# Patient Record
Sex: Male | Born: 1995 | Race: Black or African American | Hispanic: No | Marital: Single | Smoking: Never smoker
Health system: Southern US, Community
[De-identification: ages and names within clinical notes are randomized; demographics above are authoritative.]

## PROBLEM LIST (undated history)

## (undated) DIAGNOSIS — S62102A Fracture of unspecified carpal bone, left wrist, initial encounter for closed fracture: Secondary | ICD-10-CM

## (undated) HISTORY — DX: Fracture of unspecified carpal bone, left wrist, initial encounter for closed fracture: S62.102A

---
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
## 2017-02-05 ENCOUNTER — Telehealth: Payer: Self-pay | Admitting: Family Medicine

## 2017-02-05 NOTE — Telephone Encounter (Signed)
Patient dropped off a health form to be filled out for work by Dr. Leretha PolSantiago. Telephone number is [phone]. Placed in nurses box.

## 2017-05-06 IMAGING — CR DG WRIST COMPLETE 3+V*R*
4 series · 4 of 4 positions shown · non-contrast
Comparison: None.

CLINICAL DATA: Initial encounter for Patient was playing basketball
yesterday and fell onto right wrist. Patient is complaining of right
wrist pain,swelling and limited movement. Sensory, motor, and radial
pulse intact.

EXAM:
RIGHT WRIST - COMPLETE 3+ VIEW

[x wrist pa right]
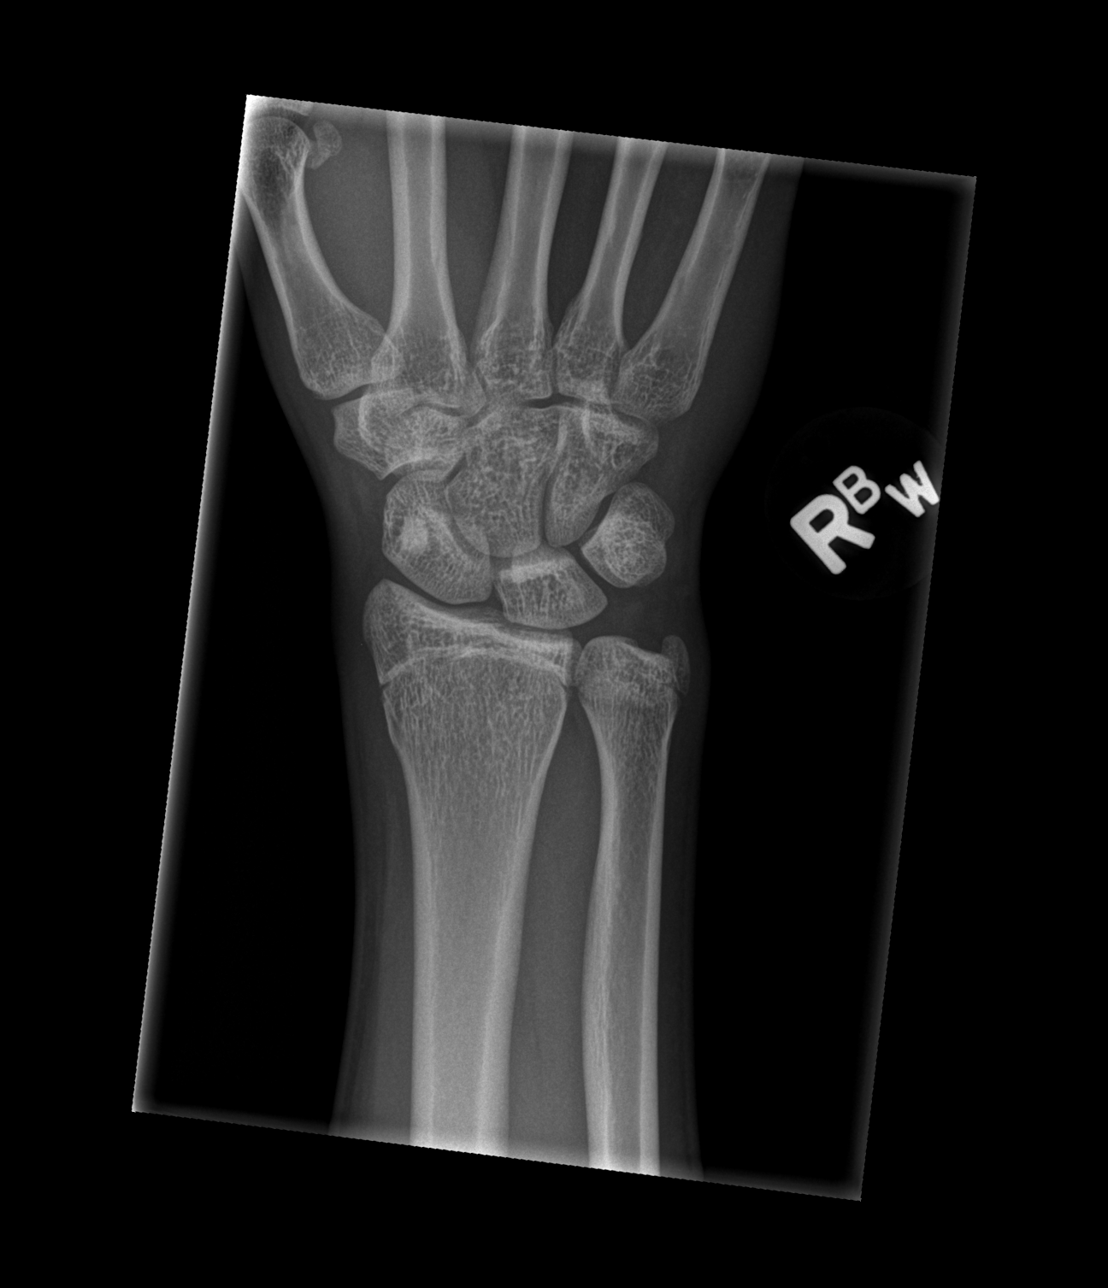

[x wrist obl right]
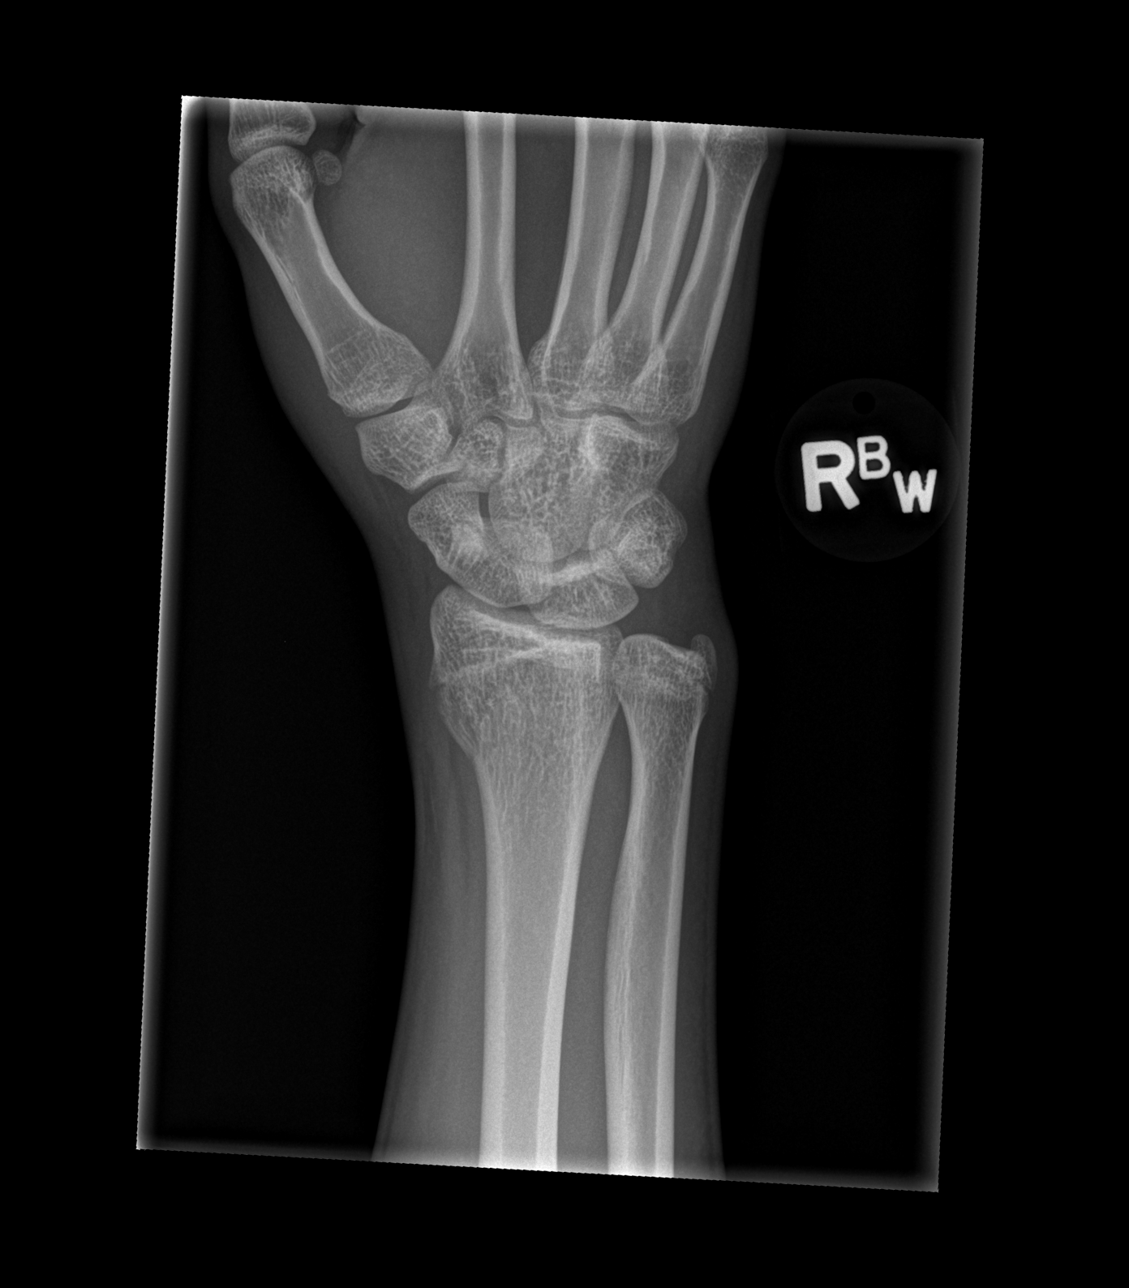

[x wrist lat right]
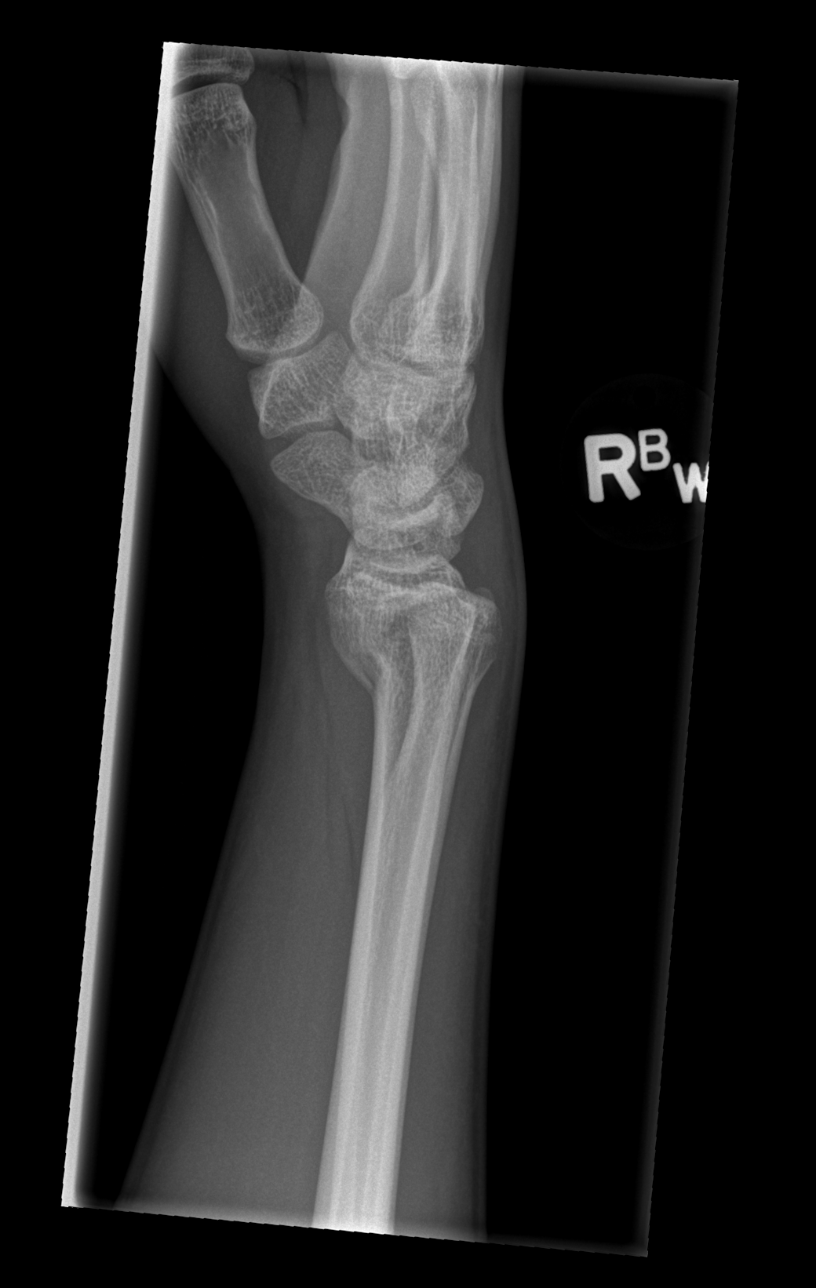

[x wrist navicular view right]
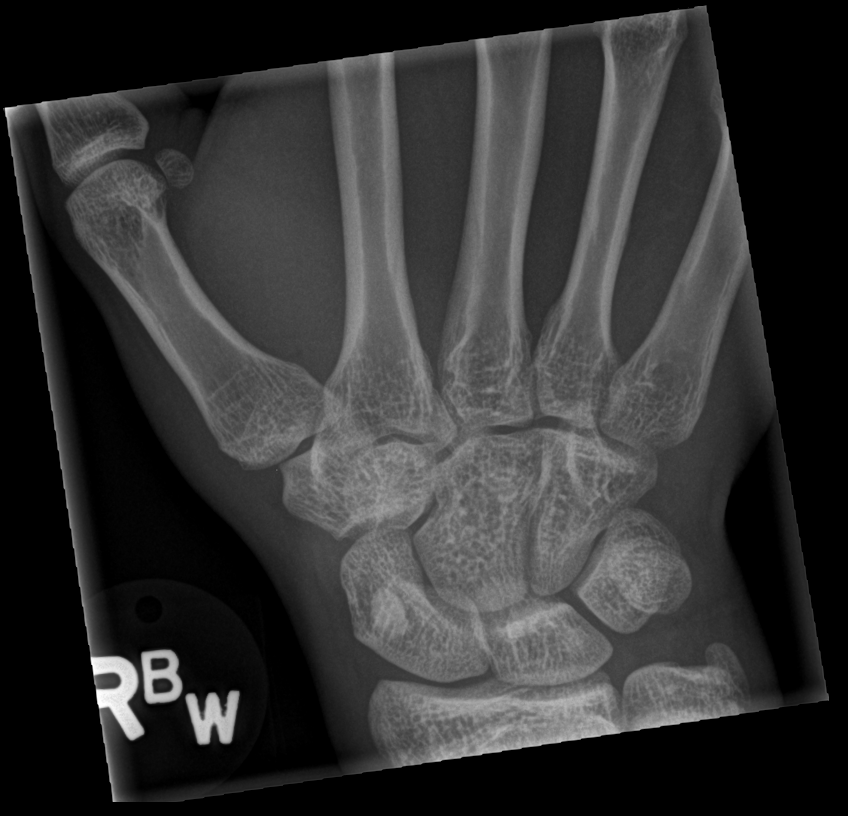

[4 of 4 positions shown; findings below may reference images not displayed]

FINDINGS: A subtle Salter type 2 fracture involves the distal radius. No
epiphyseal or intra-articular extension. Scaphoid intact. Mild
ventral angulation of the distal fracture fragment.
IMPRESSION: Salter-II distal radius fracture.
